# Patient Record
Sex: Female | Born: 2000 | Race: White | Hispanic: No | Marital: Single | Smoking: Never smoker
Health system: Southern US, Community
[De-identification: ages and names within clinical notes are randomized; demographics above are authoritative.]

---
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
## 2021-03-22 ENCOUNTER — Encounter (HOSPITAL_COMMUNITY): Payer: Self-pay

## 2021-03-22 ENCOUNTER — Ambulatory Visit (HOSPITAL_COMMUNITY)
Admission: EM | Admit: 2021-03-22 | Discharge: 2021-03-22 | Disposition: A | Discharge status: Home / Self Care | Payer: Self-pay | Attending: Physician Assistant | Admitting: Physician Assistant

## 2021-03-22 ENCOUNTER — Ambulatory Visit (INDEPENDENT_AMBULATORY_CARE_PROVIDER_SITE_OTHER): Payer: Self-pay

## 2021-03-22 DIAGNOSIS — M25512 Pain in left shoulder: Secondary | ICD-10-CM

## 2021-03-22 DIAGNOSIS — M542 Cervicalgia: Secondary | ICD-10-CM

## 2021-03-22 DIAGNOSIS — M79602 Pain in left arm: Secondary | ICD-10-CM

## 2021-03-22 DIAGNOSIS — R200 Anesthesia of skin: Secondary | ICD-10-CM

## 2021-03-22 MED ORDER — KETOROLAC TROMETHAMINE 30 MG/ML IJ SOLN
30.0000 mg | Freq: Once | INTRAMUSCULAR | Status: AC
  Administered 2021-03-22: 30 mg via INTRAMUSCULAR

## 2021-03-22 MED ORDER — KETOROLAC TROMETHAMINE 30 MG/ML IJ SOLN
INTRAMUSCULAR | Status: AC
  Filled 2021-03-22: qty 1

## 2021-03-22 MED ORDER — NAPROXEN 375 MG PO TABS: 375.0000 mg | ORAL_TABLET | Freq: Two times a day (BID) | ORAL | 0 refills | Status: AC

## 2021-03-22 MED ORDER — TIZANIDINE HCL 4 MG PO CAPS: 4.0000 mg | ORAL_CAPSULE | Freq: Three times a day (TID) | ORAL | 0 refills | Status: AC

## 2021-03-22 NOTE — ED Triage Notes (Signed)
Pt presents with left arm injury.  States she injured herself at work and has been feeling her arm numbness.

## 2021-03-22 NOTE — Discharge Instructions (Signed)
Start Naprosyn tomorrow as this is a similar medication to the Toradol that you were given in clinic.  While taking this do not take NSAIDs including aspirin, ibuprofen/Advil, naproxen/Aleve as it can cause stomach bleeding.  Use Zanaflex up to 3 times a day.  This can make you sleepy do not drive or drink alcohol with it.  Use rest, heat, stretch for additional symptom relief.  I would recommend following up with your primary care provider as if your symptoms persist or worsen you may need to consider an MRI of your neck or shoulder which is not something we can order in the urgent care.  If you have any sudden worsening of symptoms please return for reevaluation.

## 2021-03-22 NOTE — ED Provider Notes (Signed)
[facility]    CSN: [id number] Arrival date & time: 03/22/21  1523      History   Chief Complaint Chief Complaint  Patient presents with   Arm Injury    HPI Suzanne Harmon is a 20 y.o. female.   Patient presents today with a 2-day history of left neck and shoulder pain.  Reports that yesterday she was at work when a piece of glass hit her left scapula and she has had pain since that time.  Pain is rated 7 on a 0-10 pain scale, localized to left neck with radiation along trapezius into shoulder and arm, described as aching periodic sharp pains, worse with certain movements.  She denies any persistent numbness or paresthesias but does report occasional whole arm numbness in certain positions.  She has tried over-the-counter analgesics specifically ibuprofen without improvement of symptoms.  She denies previous spinal surgery or shoulder surgery.  She is right-handed.  She is having difficulty with daily activities as a result of symptoms.  Patient is confident that she is not pregnant.   History reviewed. No pertinent past medical history.  There are no problems to display for this patient.   History reviewed. No pertinent surgical history.  OB History   No obstetric history on file.      Home Medications    Prior to Admission medications   Medication Sig Start Date End Date Taking? Authorizing Provider  naproxen (NAPROSYN) 375 MG tablet Take 1 tablet (375 mg total) by mouth 2 (two) times daily. 03/22/21  Yes Danely Bayliss K, PA-C  tiZANidine (ZANAFLEX) 4 MG capsule Take 1 capsule (4 mg total) by mouth 3 (three) times daily. 03/22/21  Yes Elayah Klooster, Noberto Retort, PA-C    Family History History reviewed. No pertinent family history.  Social History Social History   Tobacco Use   Smoking status: Never   Smokeless tobacco: Never  Substance Use Topics   Alcohol use: Never   Drug use: Never     Allergies   Patient has no known allergies.   Review of  Systems Review of Systems  Constitutional:  Positive for activity change. Negative for appetite change, fatigue and fever.  Respiratory:  Negative for cough and shortness of breath.   Cardiovascular:  Negative for chest pain.  Gastrointestinal:  Negative for abdominal pain, diarrhea, nausea and vomiting.  Musculoskeletal:  Positive for arthralgias, myalgias and neck pain.  Neurological:  Positive for numbness. Negative for dizziness, weakness, light-headedness and headaches.    Physical Exam Triage Vital Signs ED Triage Vitals  Enc Vitals Group     BP 03/22/21 1654 (!) 135/94     Pulse Rate 03/22/21 1654 94     Resp 03/22/21 1654 20     Temp 03/22/21 1654 99.1 F (37.3 C)     Temp Source 03/22/21 1654 Oral     SpO2 03/22/21 1654 98 %     Weight --      Height --      Head Circumference --      Peak Flow --      Pain Score 03/22/21 1652 7     Pain Loc --      Pain Edu? --      Excl. in GC? --    No data found.  Updated Vital Signs BP (!) 135/94 (BP Location: Left Arm)   Pulse 94   Temp 99.1 F (37.3 C) (Oral)   Resp 20   LMP 08/07/2020 (Approximate)  SpO2 98%   Visual Acuity Right Eye Distance:   Left Eye Distance:   Bilateral Distance:    Right Eye Near:   Left Eye Near:    Bilateral Near:     Physical Exam Vitals reviewed.  Constitutional:      General: She is awake. She is not in acute distress.    Appearance: Normal appearance. She is normal weight. She is not ill-appearing.     Comments: Very pleasant female appears stated age in no acute distress  HENT:     Head: Normocephalic and atraumatic.  Cardiovascular:     Rate and Rhythm: Normal rate and regular rhythm.     Pulses:          Radial pulses are 2+ on the right side and 2+ on the left side.     Heart sounds: Normal heart sounds, S1 normal and S2 normal. No murmur heard.    Comments: Capillary refill within 2 seconds bilateral hands Pulmonary:     Effort: Pulmonary effort is normal.      Breath sounds: Normal breath sounds. No wheezing, rhonchi or rales.  Abdominal:     Palpations: Abdomen is soft.     Tenderness: There is no abdominal tenderness.  Musculoskeletal:     Left shoulder: Tenderness and bony tenderness present. No swelling. Decreased range of motion. Decreased strength.     Cervical back: Tenderness and bony tenderness present.     Comments: Left shoulder: Tenderness palpation over AC joint and along scapular spine.  Decreased range of motion with overhead flexion, abduction, extension.  Unable to perform Apley scratch test secondary to pain.  Hands neurovascularly intact based on two-point discrimination.  Neck: Pain percussion of vertebrae.  Tenderness palpation of left paraspinal muscles along trapezius.  No spasm noted.  Psychiatric:        Behavior: Behavior is cooperative.     UC Treatments / Results  Labs (all labs ordered are listed, but only abnormal results are displayed) Labs Reviewed - No data to display  EKG   Radiology DG Cervical Spine 2-3 Views  Result Date: 03/22/2021 CLINICAL DATA:  Blunt trauma EXAM: CERVICAL SPINE - 2-3 VIEW COMPARISON:  None. FINDINGS: Mild reversal of cervical lordosis. Vertebral body heights and disc spaces appear normal. The dens and lateral masses are obscured by overlying teeth. IMPRESSION: Mild reversal of cervical lordosis. Limited assessment of dens and lateral masses Electronically Signed   By: Jasmine Pang M.D.   On: 03/22/2021 17:38   DG Shoulder Left  Result Date: 03/22/2021 CLINICAL DATA:  Blunt trauma EXAM: LEFT SHOULDER - 2+ VIEW COMPARISON:  None. FINDINGS: There is no evidence of fracture or dislocation. There is no evidence of arthropathy or other focal bone abnormality. Soft tissues are unremarkable. IMPRESSION: Negative. Electronically Signed   By: Jasmine Pang M.D.   On: 03/22/2021 17:37    Procedures Procedures (including critical care time)  Medications Ordered in UC Medications  ketorolac  (TORADOL) 30 MG/ML injection 30 mg (30 mg Intramuscular Given 03/22/21 1740)    Initial Impression / Assessment and Plan / UC Course  I have reviewed the triage vital signs and the nursing notes.  Pertinent labs & imaging results that were available during my care of the patient were reviewed by me and considered in my medical decision making (see chart for details).      X-ray of neck and shoulder showed no acute abnormalities.  Patient was given Toradol injection with improvement of symptoms.  She was prescribed Naprosyn with instruction to start within 24 hours given Toradol injection today.  Recommended she avoid NSAIDs including aspirin, ibuprofen/Advil, naproxen/Aleve due to risk of GI bleeding.  She was given Zanaflex to be taken up to 3 times a day with instruction not to drive or drink alcohol with this medication due to associated sedation.  She was encouraged to use conservative treatment options including heat, rest, stretch.  Discussed that if symptoms persist she would need to have an MRI which need to be arranged from primary care provider.  Discussed alarm symptoms that warrant emergent evaluation.  Strict and precautions given to which patient expressed understanding.  Final Clinical Impressions(s) / UC Diagnoses   Final diagnoses:  Left arm pain  Neck pain  Acute pain of left shoulder  Arm numbness     Discharge Instructions      Start Naprosyn tomorrow as this is a similar medication to the Toradol that you were given in clinic.  While taking this do not take NSAIDs including aspirin, ibuprofen/Advil, naproxen/Aleve as it can cause stomach bleeding.  Use Zanaflex up to 3 times a day.  This can make you sleepy do not drive or drink alcohol with it.  Use rest, heat, stretch for additional symptom relief.  I would recommend following up with your primary care provider as if your symptoms persist or worsen you may need to consider an MRI of your neck or shoulder which is not  something we can order in the urgent care.  If you have any sudden worsening of symptoms please return for reevaluation.     ED Prescriptions     Medication Sig Dispense Auth. Provider   tiZANidine (ZANAFLEX) 4 MG capsule Take 1 capsule (4 mg total) by mouth 3 (three) times daily. 21 capsule Terriah Reggio K, PA-C   naproxen (NAPROSYN) 375 MG tablet Take 1 tablet (375 mg total) by mouth 2 (two) times daily. 20 tablet Adynn Caseres, Noberto Retort, PA-C      PDMP not reviewed this encounter.   Jeani Hawking, PA-C 03/22/21 1745

## 2023-03-11 IMAGING — DX DG SHOULDER 2+V*L*
3 series · 3 of 3 positions shown · non-contrast
Comparison: None.

CLINICAL DATA: Blunt trauma

EXAM:
LEFT SHOULDER - 2+ VIEW

[shoulder ap]
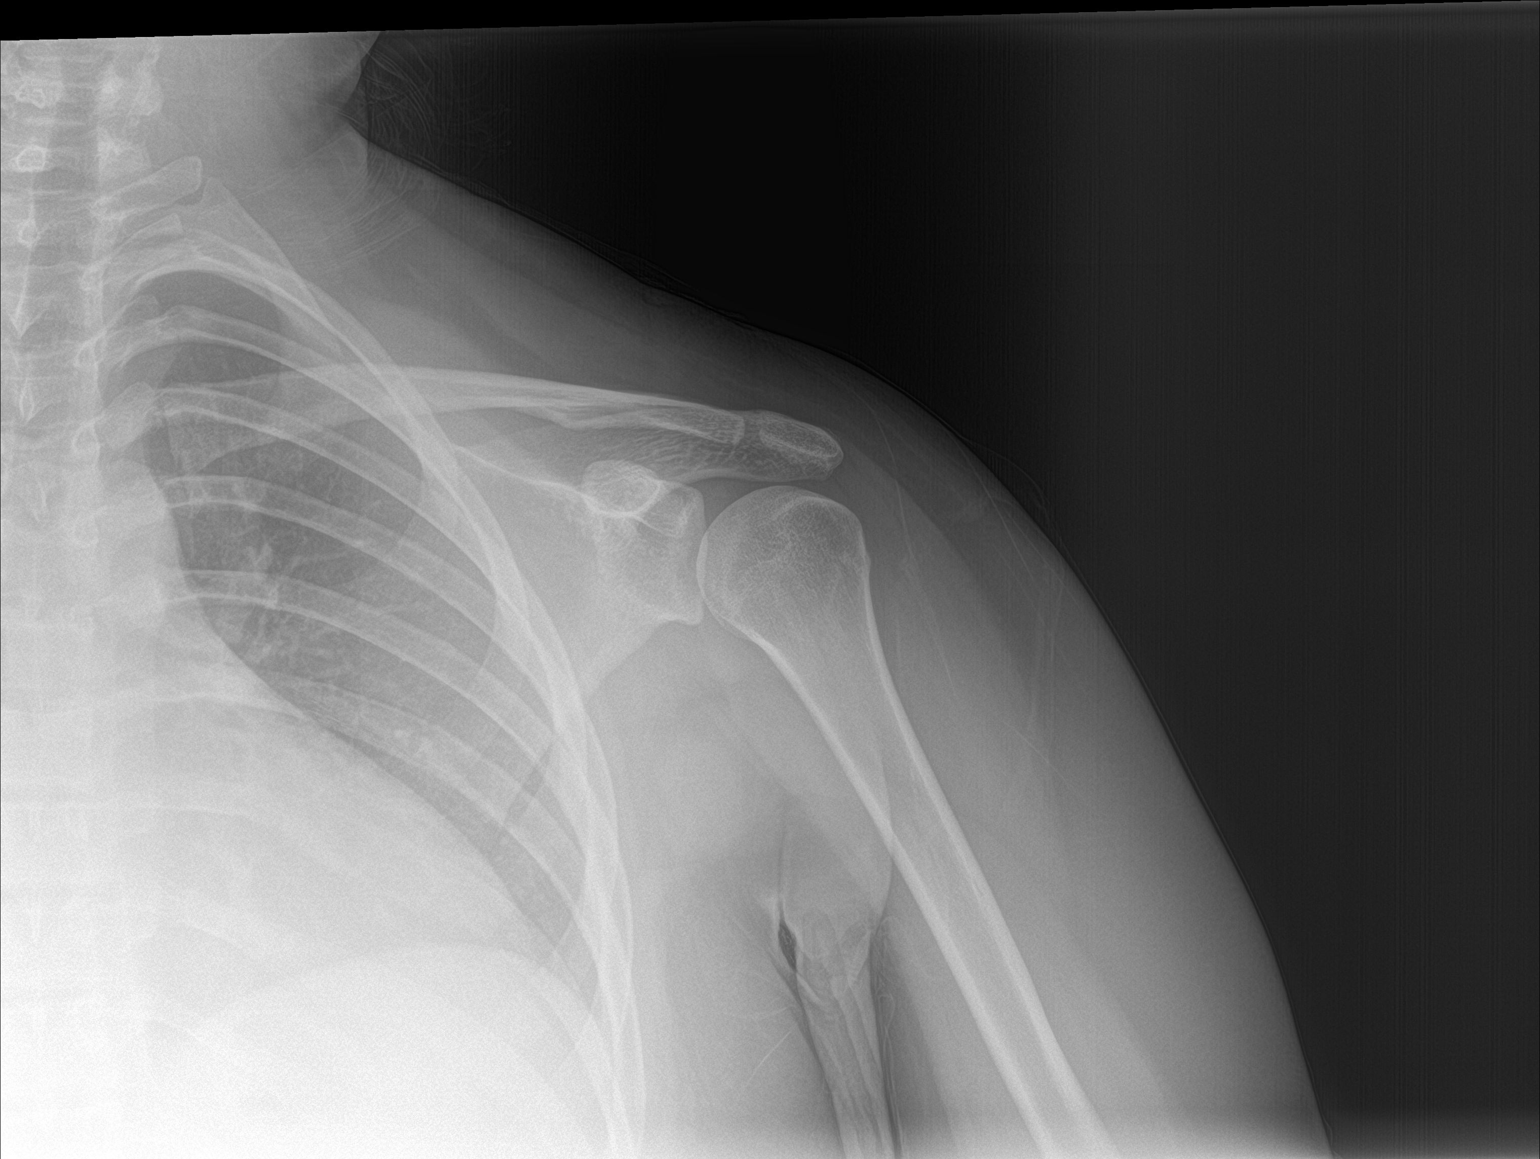

[shoulder grashey]
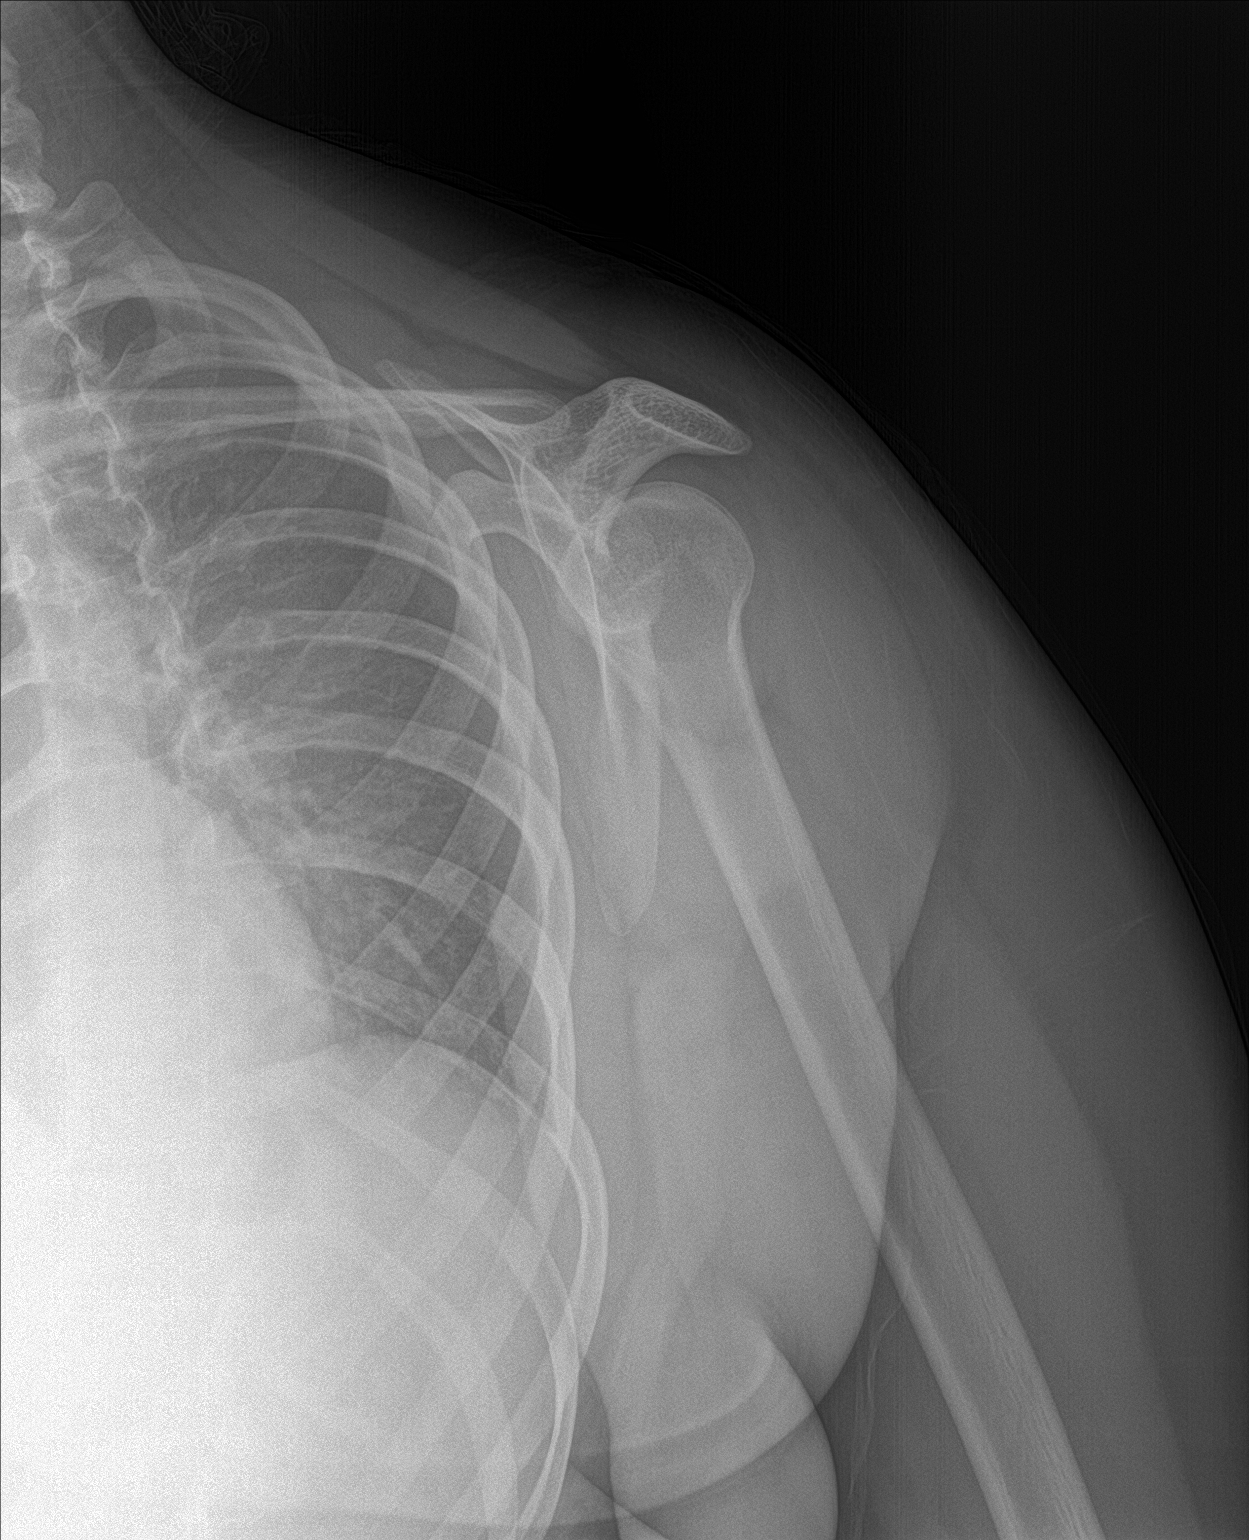

[shoulder y-view]
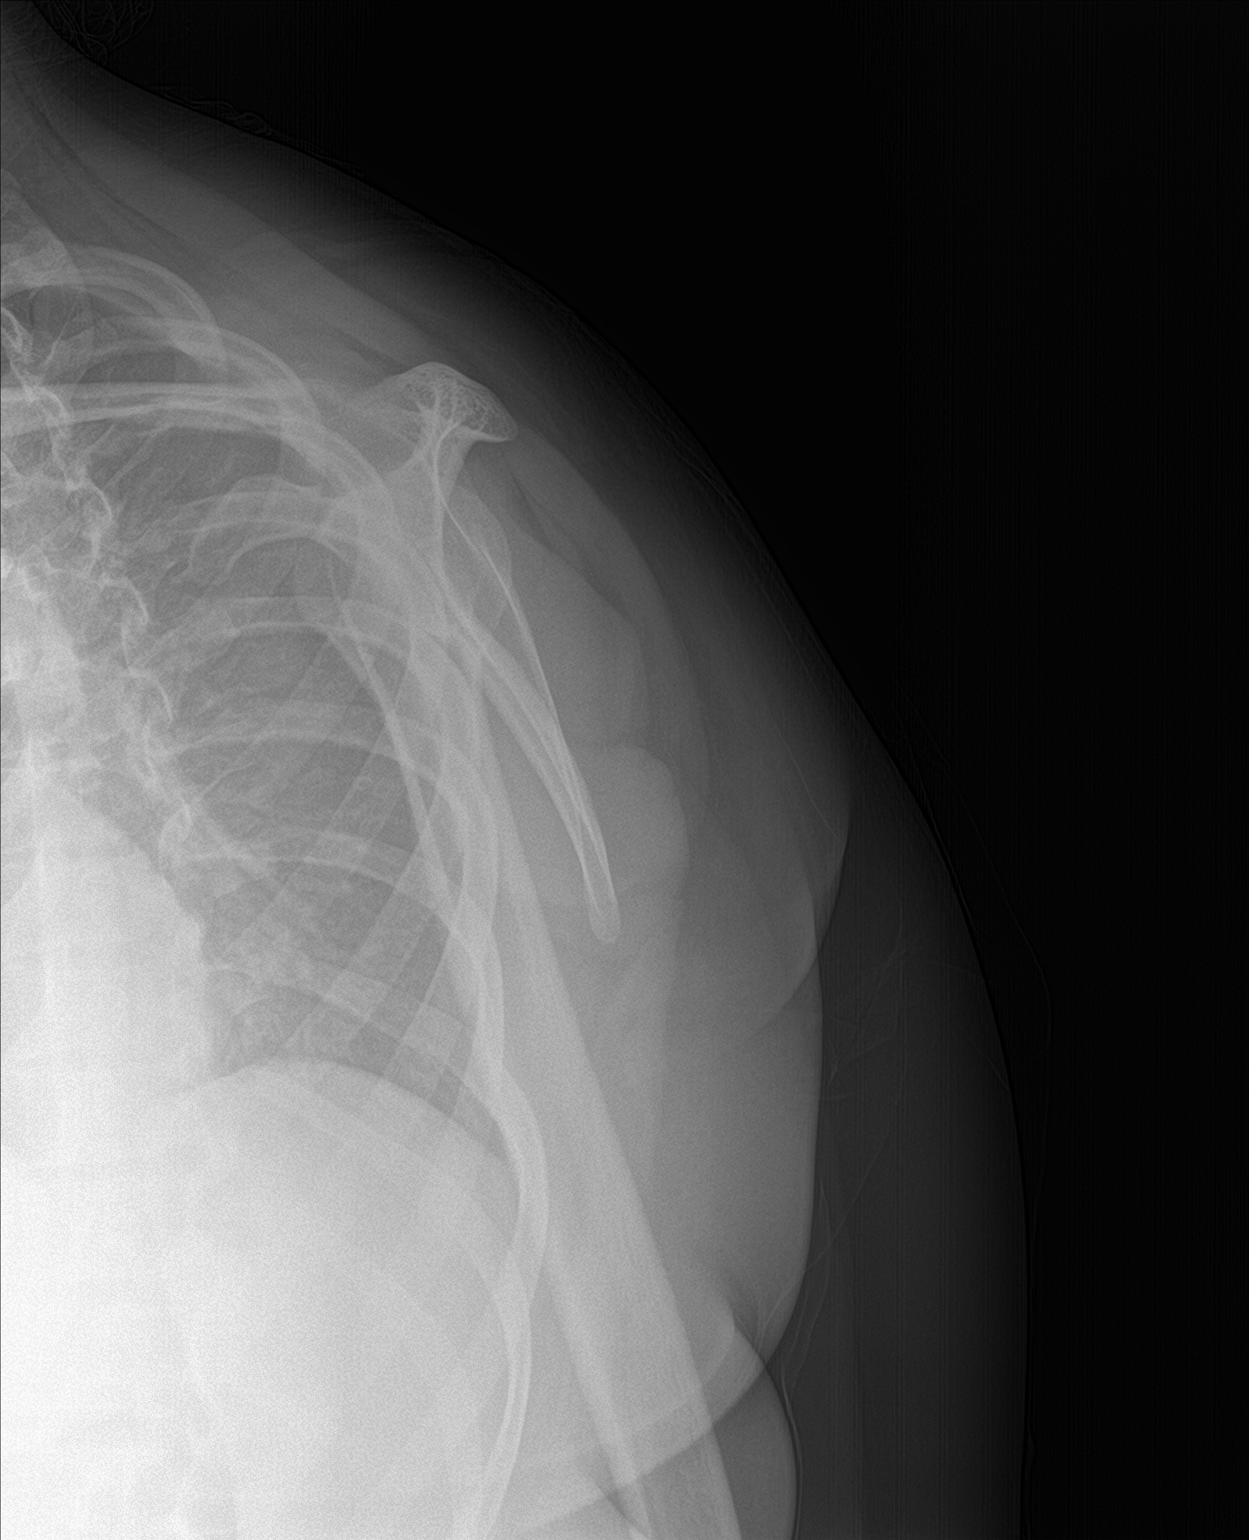

[3 of 3 positions shown; findings below may reference images not displayed]

FINDINGS: There is no evidence of fracture or dislocation. There is no
evidence of arthropathy or other focal bone abnormality. Soft
tissues are unremarkable.
IMPRESSION: Negative.
